# Patient Record
Sex: Male | Born: 1995 | Race: Black or African American | Hispanic: No | Marital: Single | Smoking: Current every day smoker
Health system: Southern US, Community
[De-identification: ages and names within clinical notes are randomized; demographics above are authoritative.]

## PROBLEM LIST (undated history)

## (undated) DIAGNOSIS — J45909 Unspecified asthma, uncomplicated: Secondary | ICD-10-CM

---

## 2015-05-25 ENCOUNTER — Emergency Department (HOSPITAL_COMMUNITY)
Admission: EM | Admit: 2015-05-25 | Discharge: 2015-05-25 | Disposition: A | Discharge status: Home / Self Care | Payer: Medicaid Other | Attending: Emergency Medicine | Admitting: Emergency Medicine

## 2015-05-25 ENCOUNTER — Encounter (HOSPITAL_COMMUNITY): Payer: Self-pay | Admitting: *Deleted

## 2015-05-25 DIAGNOSIS — Y9389 Activity, other specified: Secondary | ICD-10-CM | POA: Diagnosis not present

## 2015-05-25 DIAGNOSIS — Y998 Other external cause status: Secondary | ICD-10-CM | POA: Diagnosis not present

## 2015-05-25 DIAGNOSIS — S299XXA Unspecified injury of thorax, initial encounter: Secondary | ICD-10-CM | POA: Diagnosis not present

## 2015-05-25 DIAGNOSIS — S161XXA Strain of muscle, fascia and tendon at neck level, initial encounter: Secondary | ICD-10-CM | POA: Insufficient documentation

## 2015-05-25 DIAGNOSIS — S0990XA Unspecified injury of head, initial encounter: Secondary | ICD-10-CM | POA: Diagnosis not present

## 2015-05-25 DIAGNOSIS — Y92410 Unspecified street and highway as the place of occurrence of the external cause: Secondary | ICD-10-CM | POA: Diagnosis not present

## 2015-05-25 DIAGNOSIS — S199XXA Unspecified injury of neck, initial encounter: Secondary | ICD-10-CM | POA: Diagnosis present

## 2015-05-25 HISTORY — DX: Unspecified asthma, uncomplicated: J45.909

## 2015-05-25 MED ORDER — CYCLOBENZAPRINE HCL 10 MG PO TABS: 10.0000 mg | ORAL_TABLET | Freq: Two times a day (BID) | ORAL | Status: DC | PRN

## 2015-05-25 MED ORDER — NAPROXEN 375 MG PO TABS: 375.0000 mg | ORAL_TABLET | Freq: Two times a day (BID) | ORAL | Status: DC

## 2015-05-25 NOTE — ED Notes (Signed)
Declined W/C at D/C and was escorted to lobby by RN. 

## 2015-05-25 NOTE — ED Notes (Signed)
SEE PA assessment 
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
## 2016-06-02 ENCOUNTER — Emergency Department (HOSPITAL_COMMUNITY)
Admission: EM | Admit: 2016-06-02 | Discharge: 2016-06-02 | Disposition: A | Discharge status: Home Health | Payer: Medicaid Other | Attending: Emergency Medicine | Admitting: Emergency Medicine

## 2016-06-02 ENCOUNTER — Encounter (HOSPITAL_COMMUNITY): Payer: Self-pay | Admitting: Emergency Medicine

## 2016-06-02 DIAGNOSIS — F17210 Nicotine dependence, cigarettes, uncomplicated: Secondary | ICD-10-CM | POA: Diagnosis not present

## 2016-06-02 DIAGNOSIS — R112 Nausea with vomiting, unspecified: Secondary | ICD-10-CM | POA: Diagnosis present

## 2016-06-02 DIAGNOSIS — R197 Diarrhea, unspecified: Secondary | ICD-10-CM | POA: Diagnosis not present

## 2016-06-02 DIAGNOSIS — J45909 Unspecified asthma, uncomplicated: Secondary | ICD-10-CM | POA: Diagnosis not present

## 2016-06-02 LAB — CBC
HEMATOCRIT: 51.9 % (ref 39.0–52.0)
HEMOGLOBIN: 18.3 g/dL — AB (ref 13.0–17.0)
MCH: 31.8 pg (ref 26.0–34.0)
MCHC: 35.3 g/dL (ref 30.0–36.0)
MCV: 90.1 fL (ref 78.0–100.0)
Platelets: 247 10*3/uL (ref 150–400)
RBC: 5.76 MIL/uL (ref 4.22–5.81)
RDW: 12 % (ref 11.5–15.5)
WBC: 13.2 10*3/uL — ABNORMAL HIGH (ref 4.0–10.5)

## 2016-06-02 LAB — COMPREHENSIVE METABOLIC PANEL
ALBUMIN: 5.1 g/dL — AB (ref 3.5–5.0)
ALK PHOS: 71 U/L (ref 38–126)
ALT: 18 U/L (ref 17–63)
ANION GAP: 12 (ref 5–15)
AST: 34 U/L (ref 15–41)
BUN: 17 mg/dL (ref 6–20)
CALCIUM: 10.2 mg/dL (ref 8.9–10.3)
CO2: 21 mmol/L — AB (ref 22–32)
Chloride: 105 mmol/L (ref 101–111)
Creatinine, Ser: 1.18 mg/dL (ref 0.61–1.24)
GFR calc Af Amer: >60 mL/min (ref >60)
GFR calc non Af Amer: >60 mL/min (ref >60)
GLUCOSE: 119 mg/dL — AB (ref 65–99)
Potassium: 4.4 mmol/L (ref 3.5–5.1)
SODIUM: 138 mmol/L (ref 135–145)
Total Bilirubin: 1.1 mg/dL (ref 0.3–1.2)
Total Protein: 9 g/dL — ABNORMAL HIGH (ref 6.5–8.1)

## 2016-06-02 LAB — URINALYSIS, ROUTINE W REFLEX MICROSCOPIC
BACTERIA UA: NONE SEEN
Bilirubin Urine: NEGATIVE
Glucose, UA: NEGATIVE mg/dL
Hgb urine dipstick: NEGATIVE
Ketones, ur: 5 mg/dL — AB
Leukocytes, UA: NEGATIVE
Nitrite: NEGATIVE
PROTEIN: 30 mg/dL — AB
Specific Gravity, Urine: 1.027 (ref 1.005–1.030)
pH: 5 (ref 5.0–8.0)

## 2016-06-02 LAB — LIPASE, BLOOD: Lipase: 19 U/L (ref 11–51)

## 2016-06-02 MED ORDER — ONDANSETRON 4 MG PO TBDP
ORAL_TABLET | ORAL | Status: AC
  Filled 2016-06-02: qty 1

## 2016-06-02 MED ORDER — ONDANSETRON 4 MG PO TBDP
4.0000 mg | ORAL_TABLET | Freq: Once | ORAL | Status: AC | PRN
  Administered 2016-06-02: 4 mg via ORAL

## 2016-06-02 MED ORDER — ONDANSETRON 4 MG PO TBDP: 4.0000 mg | ORAL_TABLET | Freq: Three times a day (TID) | ORAL | 0 refills | Status: DC | PRN

## 2016-06-02 MED ORDER — SODIUM CHLORIDE 0.9 % IV BOLUS (SEPSIS)
1000.0000 mL | Freq: Once | INTRAVENOUS | Status: AC
  Administered 2016-06-02: 1000 mL via INTRAVENOUS

## 2016-06-02 NOTE — Discharge Instructions (Signed)
You were  seen in the emergency room today for nausea, vomiting and diarrhea.  You were given IV fluids and Zofran, which is an antiemetic for nausea.  On reassessment  you are  tolerating fluids.  You have been given a prescription for Zofran that you can use at home.  You've also been cautioned to avoid alcohol and to eat lightly for the next 24 hours

## 2016-06-02 NOTE — ED Provider Notes (Signed)
[facility]-EMERGENCY DEPT Provider Note   CSN: [id number] Arrival date & time: 06/02/16  1047     History   Chief Complaint Chief Complaint  Patient presents with  . Emesis  . Diarrhea    HPI Fordyce Wolken is a 20 y.o. male.  This normally healthy 20 year old who was awakened at 2 AM with acute onset of nausea, vomiting and diarrhea that has persisted since not taken any over-the-counter medication for any of his symptoms      Past Medical History:  Diagnosis Date  . Asthma     There are no active problems to display for this patient.   History reviewed. No pertinent surgical history.     Home Medications    Prior to Admission medications   Medication Sig Start Date End Date Taking? Authorizing Provider  Fluticasone-Salmeterol (ADVAIR DISKUS) 100-50 MCG/DOSE AEPB Take 1 puff by mouth 2 (two) times daily. 09/26/15 09/25/16 Yes Historical Provider, MD  cyclobenzaprine (FLEXERIL) 10 MG tablet Take 1 tablet (10 mg total) by mouth 2 (two) times daily as needed for muscle spasms. Patient not taking: Reported on 06/02/2016 05/25/15   Arthor CaptainAbigail Harris, PA-C  naproxen (NAPROSYN) 375 MG tablet Take 1 tablet (375 mg total) by mouth 2 (two) times daily. Patient not taking: Reported on 06/02/2016 05/25/15   Arthor CaptainAbigail Harris, PA-C  ondansetron (ZOFRAN ODT) 4 MG disintegrating tablet Take 1 tablet (4 mg total) by mouth every 8 (eight) hours as needed for nausea or vomiting. 06/02/16   Earley FavorGail Fares Ramthun, NP    Family History History reviewed. No pertinent family history.  Social History Social History  Substance Use Topics  . Smoking status: Current Every Day Smoker    Types: Cigarettes  . Smokeless tobacco: Never Used  . Alcohol use Yes     Comment: social     Allergies   Patient has no known allergies.   Review of Systems Review of Systems  Gastrointestinal: Positive for diarrhea, nausea and vomiting.  All other systems reviewed and are negative.    Physical Exam Updated  Vital Signs BP 119/73 (BP Location: Right Arm)   Pulse (!) 59   Temp 97.5 F (36.4 C) (Oral)   Resp 18   Ht 5\' 8"  (1.727 m)   Wt 65.8 kg   SpO2 99%   BMI 22.05 kg/m   Physical Exam  Constitutional: He appears well-developed and well-nourished. No distress.  Eyes: Pupils are equal, round, and reactive to light.  Neck: Normal range of motion.  Cardiovascular: Normal rate.   Pulmonary/Chest: Effort normal.  Abdominal: Bowel sounds are normal. He exhibits no distension. There is no tenderness. There is no guarding.  Musculoskeletal: Normal range of motion.  Skin: Skin is warm.  Nursing note and vitals reviewed.    ED Treatments / Results  Labs (all labs ordered are listed, but only abnormal results are displayed) Labs Reviewed  COMPREHENSIVE METABOLIC PANEL - Abnormal; Notable for the following:       Result Value   CO2 21 (*)    Glucose, Bld 119 (*)    Total Protein 9.0 (*)    Albumin 5.1 (*)    All other components within normal limits  CBC - Abnormal; Notable for the following:    WBC 13.2 (*)    Hemoglobin 18.3 (*)    All other components within normal limits  LIPASE, BLOOD  URINALYSIS, ROUTINE W REFLEX MICROSCOPIC    EKG  EKG Interpretation None       Radiology No results  found.  Procedures Procedures (including critical care time)  Medications Ordered in ED Medications  ondansetron (ZOFRAN-ODT) 4 MG disintegrating tablet (not administered)  ondansetron (ZOFRAN-ODT) disintegrating tablet 4 mg (4 mg Oral Given 06/02/16 1108)  sodium chloride 0.9 % bolus 1,000 mL (0 mLs Intravenous Stopped 06/02/16 1320)     Initial Impression / Assessment and Plan / ED Course  I have reviewed the triage vital signs and the nursing notes.  Pertinent labs & imaging results that were available during my care of the patient were reviewed by me and considered in my medical decision making (see chart for details).  Clinical Course      She was given a liter of IV  normal saline as well as Zofran ODT, after which she was reassessed.  He is now tolerating fluids by mouth and feeling much better.  He will be discharged home with prescription for Zofran.  He's been seen to eat lightly for the next 24 hours and to avoid alcohol  Final Clinical Impressions(s) / ED Diagnoses   Final diagnoses:  Nausea vomiting and diarrhea    New Prescriptions Discharge Medication List as of 06/02/2016  1:46 PM    START taking these medications   Details  ondansetron (ZOFRAN ODT) 4 MG disintegrating tablet Take 1 tablet (4 mg total) by mouth every 8 (eight) hours as needed for nausea or vomiting., Starting Sun 06/02/2016, Print         Earley FavorGail Kiele Heavrin, NP 06/02/16 1343    Earley FavorGail Nyaire Denbleyker, NP 06/02/16 1620    Charlynne Panderavid Hsienta Yao, MD 06/03/16 0800

## 2016-06-02 NOTE — ED Triage Notes (Signed)
Pt sts N/V/D starting this am 

## 2018-06-15 ENCOUNTER — Emergency Department (HOSPITAL_COMMUNITY): Payer: No Typology Code available for payment source

## 2018-06-15 ENCOUNTER — Encounter (HOSPITAL_COMMUNITY): Payer: Self-pay | Admitting: Emergency Medicine

## 2018-06-15 ENCOUNTER — Other Ambulatory Visit: Payer: Self-pay

## 2018-06-15 ENCOUNTER — Emergency Department (HOSPITAL_COMMUNITY)
Admission: EM | Admit: 2018-06-15 | Discharge: 2018-06-15 | Disposition: A | Discharge status: Home / Self Care | Payer: No Typology Code available for payment source | Attending: Emergency Medicine | Admitting: Emergency Medicine

## 2018-06-15 DIAGNOSIS — Y939 Activity, unspecified: Secondary | ICD-10-CM | POA: Diagnosis not present

## 2018-06-15 DIAGNOSIS — M546 Pain in thoracic spine: Secondary | ICD-10-CM | POA: Insufficient documentation

## 2018-06-15 DIAGNOSIS — Y999 Unspecified external cause status: Secondary | ICD-10-CM | POA: Diagnosis not present

## 2018-06-15 DIAGNOSIS — Y929 Unspecified place or not applicable: Secondary | ICD-10-CM | POA: Diagnosis not present

## 2018-06-15 DIAGNOSIS — M542 Cervicalgia: Secondary | ICD-10-CM | POA: Diagnosis present

## 2018-06-15 DIAGNOSIS — F17210 Nicotine dependence, cigarettes, uncomplicated: Secondary | ICD-10-CM | POA: Diagnosis not present

## 2018-06-15 DIAGNOSIS — S161XXA Strain of muscle, fascia and tendon at neck level, initial encounter: Secondary | ICD-10-CM | POA: Insufficient documentation

## 2018-06-15 MED ORDER — CYCLOBENZAPRINE HCL 10 MG PO TABS: 10.0000 mg | ORAL_TABLET | Freq: Two times a day (BID) | ORAL | 0 refills | Status: DC | PRN

## 2018-06-15 MED ORDER — ACETAMINOPHEN 500 MG PO TABS
1000.0000 mg | ORAL_TABLET | Freq: Once | ORAL | Status: AC
  Administered 2018-06-15: 1000 mg via ORAL
  Filled 2018-06-15: qty 2
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
## 2018-06-21 ENCOUNTER — Encounter (HOSPITAL_COMMUNITY): Payer: Self-pay | Admitting: Obstetrics and Gynecology

## 2018-06-21 ENCOUNTER — Emergency Department (HOSPITAL_COMMUNITY)
Admission: EM | Admit: 2018-06-21 | Discharge: 2018-06-22 | Disposition: A | Discharge status: Home / Self Care | Payer: Self-pay | Attending: Emergency Medicine | Admitting: Emergency Medicine

## 2018-06-21 DIAGNOSIS — S161XXA Strain of muscle, fascia and tendon at neck level, initial encounter: Secondary | ICD-10-CM | POA: Insufficient documentation

## 2018-06-21 DIAGNOSIS — Y929 Unspecified place or not applicable: Secondary | ICD-10-CM | POA: Insufficient documentation

## 2018-06-21 DIAGNOSIS — Y999 Unspecified external cause status: Secondary | ICD-10-CM | POA: Insufficient documentation

## 2018-06-21 DIAGNOSIS — J45909 Unspecified asthma, uncomplicated: Secondary | ICD-10-CM | POA: Insufficient documentation

## 2018-06-21 DIAGNOSIS — F17210 Nicotine dependence, cigarettes, uncomplicated: Secondary | ICD-10-CM | POA: Insufficient documentation

## 2018-06-21 DIAGNOSIS — S060X0A Concussion without loss of consciousness, initial encounter: Secondary | ICD-10-CM | POA: Insufficient documentation

## 2018-06-21 DIAGNOSIS — Y9389 Activity, other specified: Secondary | ICD-10-CM | POA: Insufficient documentation

## 2018-06-21 DIAGNOSIS — S161XXD Strain of muscle, fascia and tendon at neck level, subsequent encounter: Secondary | ICD-10-CM
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
## 2018-06-22 MED ORDER — IBUPROFEN 200 MG PO TABS
400.0000 mg | ORAL_TABLET | Freq: Once | ORAL | Status: AC
  Administered 2018-06-22: 400 mg via ORAL
  Filled 2018-06-22: qty 2

## 2018-06-22 MED ORDER — MELOXICAM 7.5 MG PO TABS: 7.5000 mg | ORAL_TABLET | Freq: Every day | ORAL | 0 refills | Status: AC

## 2018-06-22 NOTE — ED Provider Notes (Signed)
Pt without neuro deficits He can walk No signs of acute traumatic head/spinal injury Advised rest, NSAIDs, f/u as outpatient    Zadie Rhine, MD 06/22/18 0131

## 2018-06-22 NOTE — ED Notes (Signed)
When trying to discharge patient, pt requesting to speak to the provider again. MD made aware.
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
## 2023-10-13 ENCOUNTER — Encounter (HOSPITAL_COMMUNITY): Payer: Self-pay | Admitting: Emergency Medicine

## 2023-10-13 ENCOUNTER — Other Ambulatory Visit: Payer: Self-pay

## 2023-10-13 ENCOUNTER — Emergency Department (HOSPITAL_COMMUNITY)
Admission: EM | Admit: 2023-10-13 | Discharge: 2023-10-13 | Disposition: A | Discharge status: Home / Self Care | Attending: Emergency Medicine | Admitting: Emergency Medicine

## 2023-10-13 DIAGNOSIS — K0889 Other specified disorders of teeth and supporting structures: Secondary | ICD-10-CM | POA: Diagnosis present

## 2023-10-13 DIAGNOSIS — K029 Dental caries, unspecified: Secondary | ICD-10-CM | POA: Diagnosis not present

## 2023-10-13 MED ORDER — TRIAMCINOLONE ACETONIDE 0.1 % MT PSTE: 1.0000 | PASTE | Freq: Two times a day (BID) | OROMUCOSAL | 2 refills | Status: AC

## 2023-10-13 NOTE — ED Provider Notes (Signed)
  [place] EMERGENCY DEPARTMENT AT [facility] Provider Note   CSN: [id number] Arrival date & time: 10/13/23  0008     History  Chief Complaint  Patient presents with   Dental Pain    Louis Ewing is a 28 y.o. male.  28 year old male with pain in his right upper cheek area. States his tooth is decayed, has a rough edge that is rubbing a sore/cut in his cheek. No trauma, fever, drainage. No other complaints or concerns.        Home Medications Prior to Admission medications   Medication Sig Start Date End Date Taking? Authorizing Provider  triamcinolone (KENALOG) 0.1 % paste Use as directed 1 Application in the mouth or throat 2 (two) times daily. 10/13/23  Yes Darlis Eisenmenger, PA-C  meloxicam  (MOBIC ) 7.5 MG tablet Take 1 tablet (7.5 mg total) by mouth daily. 06/22/18   Eldon Greenland, MD      Allergies    Shellfish allergy    Review of Systems   Review of Systems Negative except as per HPI Physical Exam Updated Vital Signs BP (!) 146/101 (BP Location: Right Arm)   Pulse 66   Temp 97.9 F (36.6 C)   Resp 17   Ht 5\' 9"  (1.753 m)   Wt 72.6 kg   SpO2 99%   BMI 23.63 kg/m  Physical Exam Vitals and nursing note reviewed.  Constitutional:      General: He is not in acute distress.    Appearance: He is well-developed. He is not diaphoretic.  HENT:     Head: Normocephalic and atraumatic.     Jaw: No trismus.     Mouth/Throat:   Pulmonary:     Effort: Pulmonary effort is normal.  Skin:    General: Skin is warm and dry.  Neurological:     Mental Status: He is alert and oriented to person, place, and time.  Psychiatric:        Behavior: Behavior normal.     ED Results / Procedures / Treatments   Labs (all labs ordered are listed, but only abnormal results are displayed) Labs Reviewed - No data to display  EKG None  Radiology No results found.  Procedures Procedures    Medications Ordered in ED Medications - No data to display  ED  Course/ Medical Decision Making/ A&P                                 Medical Decision Making  28 yo male with right upper tooth decay causing ulceration of the buccal mucosa. Plan is to see a DDS to address the tooth. For tonight, recommend triamcinolone dental paste and bone wax to tooth to prevent further damage to the mucosa.         Final Clinical Impression(s) / ED Diagnoses Final diagnoses:  Pain, dental    Rx / DC Orders ED Discharge Orders          Ordered    triamcinolone (KENALOG) 0.1 % paste  2 times daily        10/13/23 0132              Darlis Eisenmenger, PA-C 10/13/23 0136    Lindle Rhea, MD 10/14/23 [phone]

## 2023-10-13 NOTE — ED Triage Notes (Signed)
 Pt c/o  right upper dental pain x 1 week ago.

## 2023-10-13 NOTE — Discharge Instructions (Addendum)
 Look for dental wax to apply to the rough edge of the tooth. Apply dental paste (prescription) to the sore on your cheek. Follow up with a dentist to fix the underlying tooth problem as soon as possible.
